# Patient Record
Sex: Male | Born: 1950 | Race: White | Hispanic: No | Marital: Single
Health system: Southern US, Community
[De-identification: ages and names within clinical notes are randomized; demographics above are authoritative.]

---

## 2013-09-14 ENCOUNTER — Inpatient Hospital Stay: Payer: Self-pay | Admitting: Internal Medicine

## 2013-09-14 LAB — COMPREHENSIVE METABOLIC PANEL
Albumin: 3.5 g/dL (ref 3.4–5.0)
Alkaline Phosphatase: 79 U/L
Anion Gap: 6 — ABNORMAL LOW (ref 7–16)
BUN: 26 mg/dL — AB (ref 7–18)
Bilirubin,Total: 0.9 mg/dL (ref 0.2–1.0)
CALCIUM: 9 mg/dL (ref 8.5–10.1)
Chloride: 100 mmol/L (ref 98–107)
Co2: 28 mmol/L (ref 21–32)
Creatinine: 1.33 mg/dL — ABNORMAL HIGH (ref 0.60–1.30)
EGFR (African American): >60
EGFR (Non-African Amer.): 56 — ABNORMAL LOW
GLUCOSE: 109 mg/dL — AB (ref 65–99)
Osmolality: 274 (ref 275–301)
POTASSIUM: 3.9 mmol/L (ref 3.5–5.1)
SGOT(AST): 12 U/L — ABNORMAL LOW (ref 15–37)
SGPT (ALT): 18 U/L (ref 12–78)
Sodium: 134 mmol/L — ABNORMAL LOW (ref 136–145)
TOTAL PROTEIN: 7.3 g/dL (ref 6.4–8.2)

## 2013-09-14 LAB — URINALYSIS, COMPLETE
BACTERIA: NONE SEEN
BILIRUBIN, UR: NEGATIVE
Blood: NEGATIVE
GLUCOSE, UR: NEGATIVE mg/dL (ref 0–75)
Hyaline Cast: 4
Leukocyte Esterase: NEGATIVE
Nitrite: NEGATIVE
PROTEIN: NEGATIVE
Ph: 6 (ref 4.5–8.0)
RBC,UR: 8 /HPF (ref 0–5)
SPECIFIC GRAVITY: 1.019 (ref 1.003–1.030)
Squamous Epithelial: NONE SEEN
WBC UR: <1 /HPF (ref 0–5)

## 2013-09-14 LAB — CBC WITH DIFFERENTIAL/PLATELET
BASOS PCT: 0.4 %
Basophil #: 0.1 10*3/uL (ref 0.0–0.1)
EOS ABS: 0 10*3/uL (ref 0.0–0.7)
EOS PCT: 0 %
HCT: 48.4 % (ref 40.0–52.0)
HGB: 16.1 g/dL (ref 13.0–18.0)
LYMPHS PCT: 7.6 %
Lymphocyte #: 1.6 10*3/uL (ref 1.0–3.6)
MCH: 32.3 pg (ref 26.0–34.0)
MCHC: 33.2 g/dL (ref 32.0–36.0)
MCV: 97 fL (ref 80–100)
Monocyte #: 1.1 x10 3/mm — ABNORMAL HIGH (ref 0.2–1.0)
Monocyte %: 5.5 %
NEUTROS PCT: 86.5 %
Neutrophil #: 18.1 10*3/uL — ABNORMAL HIGH (ref 1.4–6.5)
Platelet: 157 10*3/uL (ref 150–440)
RBC: 4.97 10*6/uL (ref 4.40–5.90)
RDW: 13.4 % (ref 11.5–14.5)
WBC: 20.9 10*3/uL — ABNORMAL HIGH (ref 3.8–10.6)

## 2013-09-14 LAB — CK TOTAL AND CKMB (NOT AT ARMC)
CK, TOTAL: 41 U/L (ref 35–232)
CK-MB: <0.5 ng/mL — ABNORMAL LOW (ref 0.5–3.6)

## 2013-09-14 LAB — PROTIME-INR
INR: 1
Prothrombin Time: 13.3 secs (ref 11.5–14.7)

## 2013-09-14 LAB — PRO B NATRIURETIC PEPTIDE: B-Type Natriuretic Peptide: 130 pg/mL — ABNORMAL HIGH (ref 0–125)

## 2013-09-14 LAB — RAPID INFLUENZA A&B ANTIGENS

## 2013-09-14 LAB — TROPONIN I: Troponin-I: <0.02 ng/mL

## 2013-09-14 LAB — APTT: Activated PTT: 26.2 secs (ref 23.6–35.9)

## 2013-09-15 LAB — COMPREHENSIVE METABOLIC PANEL
ALK PHOS: 65 U/L
ALT: 13 U/L (ref 12–78)
ANION GAP: 4 — AB (ref 7–16)
Albumin: 2.9 g/dL — ABNORMAL LOW (ref 3.4–5.0)
BUN: 19 mg/dL — ABNORMAL HIGH (ref 7–18)
Bilirubin,Total: 0.5 mg/dL (ref 0.2–1.0)
CALCIUM: 8.5 mg/dL (ref 8.5–10.1)
CREATININE: 1.07 mg/dL (ref 0.60–1.30)
Chloride: 105 mmol/L (ref 98–107)
Co2: 30 mmol/L (ref 21–32)
EGFR (African American): >60
EGFR (Non-African Amer.): >60
GLUCOSE: 100 mg/dL — AB (ref 65–99)
OSMOLALITY: 280 (ref 275–301)
Potassium: 4 mmol/L (ref 3.5–5.1)
SGOT(AST): 10 U/L — ABNORMAL LOW (ref 15–37)
SODIUM: 139 mmol/L (ref 136–145)
Total Protein: 6.3 g/dL — ABNORMAL LOW (ref 6.4–8.2)

## 2013-09-15 LAB — CBC WITH DIFFERENTIAL/PLATELET
BASOS ABS: 0.1 10*3/uL (ref 0.0–0.1)
Basophil %: 0.6 %
Eosinophil #: 0.1 10*3/uL (ref 0.0–0.7)
Eosinophil %: 0.9 %
HCT: 43.1 % (ref 40.0–52.0)
HGB: 14.2 g/dL (ref 13.0–18.0)
LYMPHS ABS: 1.4 10*3/uL (ref 1.0–3.6)
LYMPHS PCT: 12.3 %
MCH: 32.2 pg (ref 26.0–34.0)
MCHC: 32.8 g/dL (ref 32.0–36.0)
MCV: 98 fL (ref 80–100)
MONOS PCT: 5.1 %
Monocyte #: 0.6 x10 3/mm (ref 0.2–1.0)
Neutrophil #: 8.9 10*3/uL — ABNORMAL HIGH (ref 1.4–6.5)
Neutrophil %: 81.1 %
PLATELETS: 117 10*3/uL — AB (ref 150–440)
RBC: 4.4 10*6/uL (ref 4.40–5.90)
RDW: 13.4 % (ref 11.5–14.5)
WBC: 11 10*3/uL — AB (ref 3.8–10.6)

## 2013-09-16 LAB — CBC WITH DIFFERENTIAL/PLATELET
Basophil #: 0 10*3/uL (ref 0.0–0.1)
Basophil %: 0.4 %
Eosinophil #: 0.2 10*3/uL (ref 0.0–0.7)
Eosinophil %: 2.3 %
HCT: 40 % (ref 40.0–52.0)
HGB: 13.5 g/dL (ref 13.0–18.0)
Lymphocyte #: 1.4 10*3/uL (ref 1.0–3.6)
Lymphocyte %: 19 %
MCH: 32.8 pg (ref 26.0–34.0)
MCHC: 33.8 g/dL (ref 32.0–36.0)
MCV: 97 fL (ref 80–100)
Monocyte #: 0.4 x10 3/mm (ref 0.2–1.0)
Monocyte %: 5.8 %
Neutrophil #: 5.3 10*3/uL (ref 1.4–6.5)
Neutrophil %: 72.5 %
PLATELETS: 115 10*3/uL — AB (ref 150–440)
RBC: 4.12 10*6/uL — AB (ref 4.40–5.90)
RDW: 13.4 % (ref 11.5–14.5)
WBC: 7.4 10*3/uL (ref 3.8–10.6)

## 2013-09-16 LAB — BASIC METABOLIC PANEL
Anion Gap: 3 — ABNORMAL LOW (ref 7–16)
BUN: 9 mg/dL (ref 7–18)
CO2: 29 mmol/L (ref 21–32)
CREATININE: 0.86 mg/dL (ref 0.60–1.30)
Calcium, Total: 8.6 mg/dL (ref 8.5–10.1)
Chloride: 107 mmol/L (ref 98–107)
EGFR (African American): >60
EGFR (Non-African Amer.): >60
Glucose: 97 mg/dL (ref 65–99)
Osmolality: 276 (ref 275–301)
POTASSIUM: 3.6 mmol/L (ref 3.5–5.1)
Sodium: 139 mmol/L (ref 136–145)

## 2013-09-16 LAB — URINE CULTURE

## 2013-09-19 LAB — CULTURE, BLOOD (SINGLE)

## 2013-09-21 LAB — EXPECTORATED SPUTUM ASSESSMENT W GRAM STAIN, RFLX TO RESP C

## 2014-12-17 NOTE — Discharge Summary (Signed)
PATIENT NAME:  David BowensRICHARDSON, Montoya MR#:  [id number] DATE OF BIRTH:  1951-01-20  DATE OF ADMISSION:  09/14/2013 DATE OF DISCHARGE:    Addendum   While under further review, assessing the patient more carefully, the patient actually has paranoid thoughts that somebody is going to shoot him if he leaves here. I really am not quite sure that it will be safe leaving the facility for which I am going to start an involuntary commitment and not discharge him home. He was alert and oriented x 3 when we talked, but now he has some delusions that I think might be secondary to an organic cause, mainly the infection versus side effects of the steroids.   The patient has psychiatric history as well, and I think that, overall, he needs to be further reviewed before making the decision of the patient going home. I am going to ask psychiatry to come and see him right now and continue evaluation from there.  ____________________________ Felipa Furnaceoberto Sanchez Gutierrez, MD rsg:dmm D: 09/16/2013 12:29:00 ET T: 09/16/2013 13:04:29 ET JOB#: [id number]  cc: Felipa Furnaceoberto Sanchez Gutierrez, MD, <Dictator> Teegan Guinther Juanda ChanceSANCHEZ GUTIERRE MD ELECTRONICALLY SIGNED 09/19/2013 8:30

## 2014-12-17 NOTE — Discharge Summary (Signed)
PATIENT NAME:  David Lin, David Lin MR#:  [id number] DATE OF BIRTH:  September 23, 1950  DATE OF ADMISSION:  09/14/2013 DATE OF DISCHARGE:  09/16/2013  REASON FOR ADMISSION: Shortness of breath. He had also some level of confusion and hypotension.   HISTORY OF PRESENT ILLNESS: This is a 64 year old gentleman who lives with his son. Has history of hypertension, COPD, back problems. He is a heavy smoker. He was brought to the Emergency Department complaining of being confused and hypotensive. He has significant exacerbation of his COPD with significant shortness of breath. His confusion cleared up. Actually right now he is alert, oriented x 3, but he wants to be discharged immediately. The patient states that his daughter is in some trouble with her boyfriend for what he needs to go and attend to her. I have mentioned in the past that he is not ready to be discharged and one I do not want just to discharge him AMA,  I want to make sure that he gets his prescriptions for which a prescription for prednisone has been given to the patient to taper down from 50 every day for one day down to 0 mg, decreasing 10 mg every day. The patient is also going to be given a prescription for Levaquin 750 mg p.o. every day for the next six days to complete seven days treatment.   On admission, the patient had a temperature of 100.3 a pulse of 93 and blood pressure 72/50. IV fluids were given to improve his blood pressure and soon after his blood pressure improved he was able to go into regular hospital bed. The patient is starting to clear up his mentation, and again and right now he is alert, oriented x 3. He wants to go home, despite the fact that he might not be quite ready for it, but overall seems to be capable to make his own decisions at this moment.   Smoking cessation has been given to the patient.   As far as his dehydration, his creatinine was 1.33 on admission. He received IV fluids and right now he looks well hydrated. He  had acute hypoxic respiratory failure with oxygen saturation of 59, and he takes oxygen at home. The patient states that he has his oxygen at home, but he does not want to wait for us to help him getting a portable tank.   TIME SPENT: I spent about 45 minutes with this discharge today. The patient would have been going home against medical advice, but we decided to discharge him as he is able to make his own decisions and he feels good enough to go home.   ____________________________ Felipa Furnaceoberto Sanchez Gutierrez, MD rsg:sg D: 09/16/2013 12:14:37 ET T: 09/16/2013 12:24:53 ET JOB#: [id number]  cc: Felipa Furnaceoberto Sanchez Gutierrez, MD, <Dictator> Ashritha Desrosiers Juanda ChanceSANCHEZ GUTIERRE MD ELECTRONICALLY SIGNED 09/19/2013 8:30

## 2014-12-17 NOTE — H&P (Signed)
PATIENT NAME:  David Lin, David Lin MR#:  [id number] DATE OF BIRTH:  [birth year]  DATE OF ADMISSION:  09/14/2013  PRIMARY CARE PHYSICIAN:  Unknown.  REFERRING EMERGENCY ROOM PHYSICIAN:  Dr. Sharma CovertNorman, Ray[place] Citizenaroline.   CHIEF COMPLAINT: Confusion and hypotension.   HISTORY OF PRESENT ILLNESS: A 64 year old male. His son lives with him. He has past history of hypertension, smoker and COPD and some chronic back injury in the past. Since then, there is lower back pain as per the HPI in the ER records. His son brought him to the Emergency Room because he was complaining of some back pain and he was confused. When triage nurse saw him, she found him hypotensive and tachycardic and so brought him to one of the rooms and gave him IV fluids and on further initial work up he was found having mild dehydration with evidence of pneumonia on chest x-ray and elevated white cell count. On further examination, he was able to move his lower limbs to the full range without any significant discomfort or restriction of the moment because of pain. And on questioning, he says that this pain he has in the lower back and left hip is almost 15 years since he had some injury. Initially when ER physician saw him, the patient appeared a little confused, but he was agreeing that he had some cough. Denied any fever and agreed that he had some pain with the urine. Because of hypotension, she gave some IV fluids and the patient became a little more alert and oriented after receiving IV fluids. Blood pressure is reasonably stable after getting IV fluids, but hospitalist services is contacted for further management of his symptoms of sepsis.  REVIEW OF SYSTEMS:  Denies for fever, fatigue, weakness, but has chronic pain in the lower back.  EYES: No blurring, double vision, discharge or redness.  EARS, NOSE, THROAT: No tinnitus, ear pain or hearing loss.  RESPIRATORY: The patient says he has some cough but no sputum. No wheezing.  CARDIOVASCULAR: No  chest pain, orthopnea, edema or palpitations.  GASTROINTESTINAL: No nausea, vomiting, diarrhea, abdominal pain.  GENITOURINARY: The patient states he has some dysuria but no hematuria or increased frequency.  ENDOCRINE: No increased sweating. No heat or cold intolerance.  SKIN: No acne, rashes or lesions.  MUSCULOSKELETAL: Has chronic low back pain and complaint of pain in the left hip. No swelling.  NEUROLOGICAL: No numbness, weakness, tremor or vertigo.  PSYCHIATRIC: Denies any anxiety, insomnia or bipolar disorder.   PAST MEDICAL HISTORY: Hypertension, COPD, chronic back pain, hyperlipidemia.   PAST SURGICAL HISTORY: Knee replacement surgery on the left.   SOCIAL HISTORY: Lives with son, able to walk without any support. He is a smoker, 1-1/2 packs of cigarettes every day and denies any alcohol or drug use.   FAMILY HISTORY: Denies coronary artery disease or cancer.   HOME MEDICATIONS: 1.  Acetaminophen and hydrocodone 325/10 mg oral tablet every 6 hours.  2.  Aspirin 325 mg oral tablet once a day.  3.  Benztropine 0.5 mg oral tablet once a day.  4.  Breo inhaler 1 puff inhalation once a day.  5.  Clonazepam 1 mg oral tablet 2 times a day.  6.  Daliresp 500 mcg oral tablet once a day.  7.  Gabapentin 300 mg oral capsule 3 times a day.  8.  Hydrochlorothiazide plus lisinopril 12.5/10 mg oral tablet once a day.  9.  Loratadine 10 mg oral tablet once a day.  10.  Nexium 40 mg oral  delayed-release capsule once a day.  11.  Simvastatin 10 mg oral tablet once a day.  12.  Tudorza 400 mcg inhalation powder 1 puff 2 times a day.  PHYSICAL EXAMINATION: VITAL SIGNS:  In ER, temperature 100.3, pulse 93, blood pressure 72/50 and oxygen saturation 92 on oxygen supplementation.  GENERAL: The patient is somewhat disheveled but he is oriented to time, place and person. Appears a little bit slow mentally at this time, but he knows about the year, month, president's name and where he is. HEAD AND  NECK:  Atraumatic. Conjunctivae pink. Oral mucosa moist.  NECK: Supple. No JVD.  RESPIRATORY: Bilateral equal air entry, some crepitation present.  CARDIOVASCULAR: S1, S2 present, regular. No murmur.  ABDOMEN: Soft, nontender. Bowel sounds present. No organomegaly.  SKIN: No rashes. LEGS: No edema.  NEUROLOGICAL: Power 5/5. Moves all 4 limbs, able to raise both lower limbs up to 60 degree angle with the stretcher. No tremors.  PSYCHIATRIC: Does not appear in any acute illness at this time.  IMPORTANT LABORATORY RESULTS:  BNP 130, glucose 109, BUN 26, creatinine 1.33. Sodium 134, potassium 3.9, total protein 7.3, bilirubin 0.9, alkaline phosphatase 79, SGOT 12 and SGPT 18.  Troponin less than 0.02. WBC 20,900. Hemoglobin 16.1, platelets 157, MCV 97.  INR 1. Influenza A and B are negative. Urinalysis is grossly negative. ABG: PH 7.38, pCO2 41 and pO2 is 59 on the 24% FiO2.  EKG shows sinus tachycardia with ventricular rate up to 107. Chest x-ray is reported without no acute cardiopulmonary disease but when I reviewed the picture, it is having right lower lobe infiltrative changes and some mild infiltrate in left lower lobe also.   ASSESSMENT AND PLAN: A 64 year old male with past medical history of hypertension, chronic obstructive pulmonary disease and smoker, came to Emergency Room with some back pain, but found having evidence of pneumonia on chest x-ray, elevated white cell count and sepsis with hypotension and tachycardia.  1.  Sepsis. This is secondary to pneumonia. Will treat the underlying cause. He received IV fluids 2 liters and blood pressure responded and currently he is running on lower normal side. We will continue IV fluids and continue monitoring.  2.  Pneumonia. Because of presence with sepsis, we will treat it with broad-spectrum antibiotics, Zosyn, vancomycin and erythromycin and do sputum culture and blood culture.  3.  Chronic obstructive pulmonary disease. Currently, there is no  wheezing. No signs of exacerbation. We will continue his nebulizers as he was getting at home.  4.  Acute hypoxic respiratory failure. His pO2 on ABG is 59 on 24% FiO2 supplementation. We will continue giving him supplemental oxygen and will repeat the chest x-ray tomorrow to see further evidence of his pulmonary findings.  5.  Dehydration. Creatinine is 1.33. Currently, we do not have his baseline but we will give IV fluids and follow it tomorrow for further correction.  6.  Altered mental status. This was present on admission, especially on arrival to Emergency Room. Most likely it is metabolic encephalopathy secondary to infection and sepsis but appears to be resolving after getting IV fluids, 2 liters. Still mild slowness present, but he is oriented and able to give me his information. We will treat underlying cause. CT scan of the head is negative.  7.  Smoking. The patient is a current smoker. Cessation counseling is done for 4 minutes and offered him nicotine patch.  He agreed to try it here.  TOTAL TIME SPENT ON THIS ADMISSION:  Critical  care: 60 minutes   ____________________________ Hope Pigeon Elisabeth Pigeon, MD vgv:ce D: 09/14/2013 16:02:00 ET T: 09/14/2013 17:19:29 ET JOB#: [id number]  cc: Hope Pigeon. Elisabeth Pigeon, MD, <Dictator> Altamese Dilling MD ELECTRONICALLY SIGNED 09/17/2013 18:54

## 2014-12-17 NOTE — Discharge Summary (Signed)
PATIENT NAME:  David Lin, David MR#:  [id number] DATE OF BIRTH:  [birth year]  DATE OF ADMISSION:  09/14/2013 DATE OF DISCHARGE:  09/16/2013  Addendum to Discharge Summary   The patient actually ended up leaving after the family went up and picked him up. Due to the confusion of the patient and some degree of delirium, we decided to initiate involuntary commitment papers. We contacted psychiatry department, Dr. Garnetta BuddyFaheem, who evaluated him, and she noticed that he was really confused and he was delirious. She thought the delirium was mostly secondary to benzodiazepines withdrawal. The patient was put back on his chronic benzos, and she established that he was not dangerous or a danger to himself or anybody else, for which he could leave if he wanted to. I convinced him to stay until his family came to pick him up, and he did stay. His son picked him up in the afternoon, and they were comfortable taking him home.   As per medical problems go:  1. Acute delirium. This is a mix of problems secondary to sepsis, secondary to steroid use and also secondary to benzodiazepine withdrawal. The patient was not safe to be discharged earlier, but now that the family picked him up, he will be safe to go.  2. Sepsis. The patient was admitted with significant hypotension, meeting criteria for severe sepsis. He was tachycardic, and his temperatures were slightly elevated. The patient required IV fluids up to 2 liters, and his blood pressure responded well. Source of this was pulmonary, likely pneumonia. Sepsis syndrome has resolved by the time the patient is discharged, and it was present on admission.  3. Pneumonia, community acquired. The patient had a chest x-ray by radiology described as not active cardiopulmonary disease, but clinically, the patient had significant respiratory problems, and we categorized his x-ray as showing beginning of an infiltrate on the perihilar area on the right, which is consistent with  pneumonia. The patient treated with Levaquin. 4. Chronic obstructive pulmonary disease. The patient was on steroids. Steroids were tapered down. At discharge, was given a prescription for prednisone. Since the patient had some psychiatrist problems, including bipolar, that might have helped him to be delirious. Now that he is on benzodiazepines, be better, and he is going to taper down his steroids quickly. 6. Acute hypoxic respiratory failure. His ABGs show a pO2 of 59 on 24% FiO2. He required supplemental oxygen. He does have acute on chronic respiratory failure. He uses 2 liters at home. The patient says that he does not use it all the time. He ambulates without oxygen occasionally.  7. Dehydration. IV fluids were given. 8. Altered mental status. As mentioned above, he was delirious.  9. Smoking. Smoking cessation has been given to the patient.  TIME SPENT: I spent about 75 minutes at discharge on this patient.   ____________________________ Felipa Furnaceoberto Sanchez Gutierrez, MD rsg:lb D: 09/18/2013 06:37:14 ET T: 09/18/2013 07:13:12 ET JOB#: [id number]  cc: Felipa Furnaceoberto Sanchez Gutierrez, MD, <Dictator> Katonya Blecher Juanda ChanceSANCHEZ GUTIERRE MD ELECTRONICALLY SIGNED 09/19/2013 8:31

## 2014-12-17 NOTE — Consult Note (Signed)
PATIENT NAME:  David Lin, David Lin MR#:  [id number] DATE OF BIRTH:  1951-01-09  DATE OF CONSULTATION:  09/16/2013  REFERRING PHYSICIAN:   CONSULTING PHYSICIAN:  Linford Quintela S. Garnetta Buddy, MD  REASON FOR CONSULTATION:  Confusion and paranoid ideation and questionable involuntary commitment.   HISTORY OF PRESENT ILLNESS:  The patient is a 64 year old male who presented to the Emergency Room as he has been having back pain and was confused. When the triage nurse saw him in the ED, he was found to be hypotensive and tachycardic and he was given IV fluids and the further workup showed dehydration and evidence of pneumonia. He was admitted to the medical floor. The patient was able to move his lower limbs without any significant discomfort or restriction of the movement. The patient was complaining of lower back and left hip pain for the past 15 years. Initially the ER physician saw him, he appeared a little confused but he also agreed that he has cough. The patient was stabilized on the medical floor and he was about to be discharged when he started showing some signs of paranoia and was telling the staff that the bank robbers are after him and they are going to shoot him. He appeared to be somewhat hallucinating. His medical physicians started becoming concerned about his behavior and placed a psychiatric consult.   During my interview, the patient appeared tremulous and stated that he is currently in the hospital. He reported that he is concerned because the second and the third shift staff do not behavior well and they are giving him some medications. He stated that he is taking gabapentin for his nerve pain as well as some other medication for his anxiety. He reported that he is shaking because he has not received the medication in the past few days. He reported that he follows with his primary care physician and was trying to pull out the card of his primary care physician from his wallet. However, his tremors became  worse when he was showing me the card. He also stated that he does not have glasses at this time and is unable to read the names. However, when he showed me his card, it says [facility] with the names of Dr. Ardelle Park and Dr. Allena Katz. He stated that he follows with them on a regular basis. The patient stated that he wants to leave and he will go home and come back after the weekend. He stated that he does not feel safe staying in the hospital because he has some problems with the staff here. He does not appear to be having any auditory or visual hallucinations at this time.   PAST PSYCHIATRIC HISTORY:  The patient reported that he is getting Klonopin for his anxiety as well as gabapentin for his nerve pain. He denied feeling depressed or anxious at this time. He denied having any thoughts to harm himself or anybody else.   PAST MEDICAL HISTORY:  Hypertension, COPD, chronic back pain, hyperlipidemia, status post knee replacement surgery on the left.   SOCIAL HISTORY:  The patient currently lives with his son and able to walk without any support. He is a smoker and smokes only 1-1/2 pack of cigarettes on a daily basis. He denied any use of drugs or alcohol. He stated sometimes he drinks a shot of beer whenever his anxiety gets worse.   FAMILY HISTORY:  Denies coronary artery disease or cancer.   CURRENT HOME MEDICATIONS:  1.  Acetaminophen and hydrocodone 325/10 every 6  hours.  2.  Aspirin 325 mg once daily.  3.  Benztropine 0.5 mg once a day. 4.  Breo inhaler 1 puff once daily.  5.  Clonazepam 1 mg twice a day. 6.  Daliresp 500 mcg once daily.  7.  Gabapentin 300 mg t.i.d. 8.  Hydrochlorothiazide plus lisinopril 12.5/10 once daily. 9.  Loratadine 10 mg daily.  10.  Nexium 40 mg daily.  11.  Simvastatin 10 mg daily. 12.  Tudorza 400 mcg inhaler twice daily.   VITAL SIGNS:  Temperature 98.8, pulse 75, respirations 18, blood pressure 96/55.   LABORATORY, DIAGNOSTIC, AND RADIOLOGICAL  DATA:  Glucose 97, BUN 9, creatinine 0.86, sodium 139, potassium 3.6, chloride 107, bicarbonate 29, creatinine anion gap 3, osmolality 276. Protein 6.3, albumin is 2.9, bilirubin 20.5, alkaline phosphatase 65, AST 10, ALT 13. WBC 7.4, RBC 4.12, hemoglobin 13.5, platelet count 115, MCV is 97, RDW is 13.4.   MENTAL STATUS EXAMINATION:  The patient is a moderately built male who appeared his stated age. He was tremulous during the interview. He maintained fair eye contact. His speech was low in tone and volume. Mood was anxious. Affect was congruent. Thought processes are circumstantial. Thought content was nondelusional. He currently denied having any suicidal ideations or plans. He appeared apprehensive during the interview. He is not responding to any internal stimuli at this time.   DIAGNOSTIC IMPRESSION:  AXIS I: 1.  Anxiety disorder due to general medical condition, chronic obstructive                                 pulmonary disease.                2.  Benzodiazepine dependence.  AXIS II: None.  AXIS III: Please review the medical history.   TREATMENT PLAN:  1.  The patient does not meet the criteria for involuntary commitment at this time as he is not having any suicidal or homicidal ideations or plans. The case discussed with the staff as well as Dr. Mordecai MaesSanchez.  2.  I will restart him back on clonazepam 1 mg p.o. b.i.d. and the first dose given at this time.  3.  I will also start him on Haldol 2 mg p.o. b.i.d. for the next 3 days too help him with the paranoia.  I discussed the case with Dr. Mordecai MaesSanchez and he will  discharged him in the next few days. The patient agreed to stay in the hospital to complete his treatment. He will be monitored closely by the staff.   Thank you for allowing me to participate in the care of this patient.   ____________________________ Ardeen FillersUzma S. Garnetta BuddyFaheem, MD usf:jm D: 09/16/2013 14:48:11 ET T: 09/16/2013 15:13:53 ET JOB#: [id number]  cc: Ardeen FillersUzma S. Garnetta BuddyFaheem, MD,  <Dictator> Rhunette CroftUZMA S Iridessa Harrow MD ELECTRONICALLY SIGNED 09/16/2013 15:58

## 2015-01-26 IMAGING — CR DG CHEST 2V
1 series · 2 of 2 positions shown · non-contrast
Comparison: 03/22/2012

CLINICAL DATA: Cough.  Altered mental status.

EXAM:
CHEST  2 VIEW

[Series 2: x chest ap · 0.14mm/px · 2 of 2 slices shown]
[im 1/2]
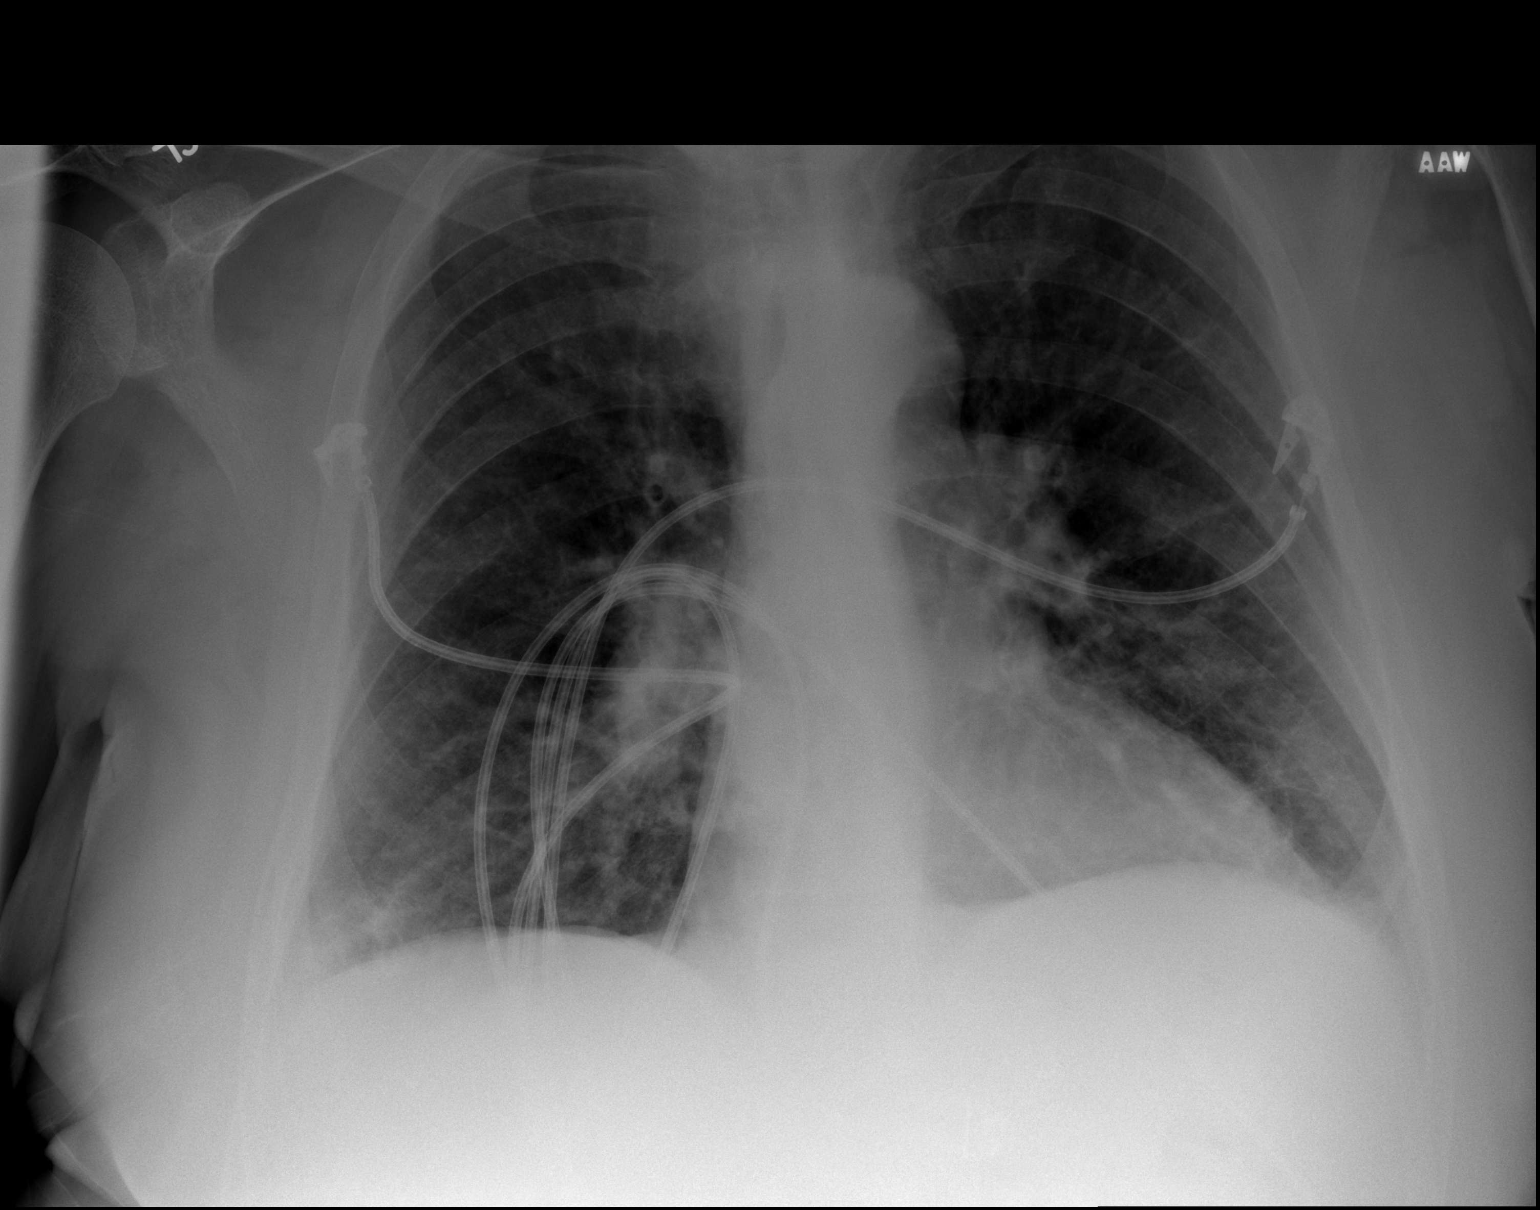
[im 2/2]
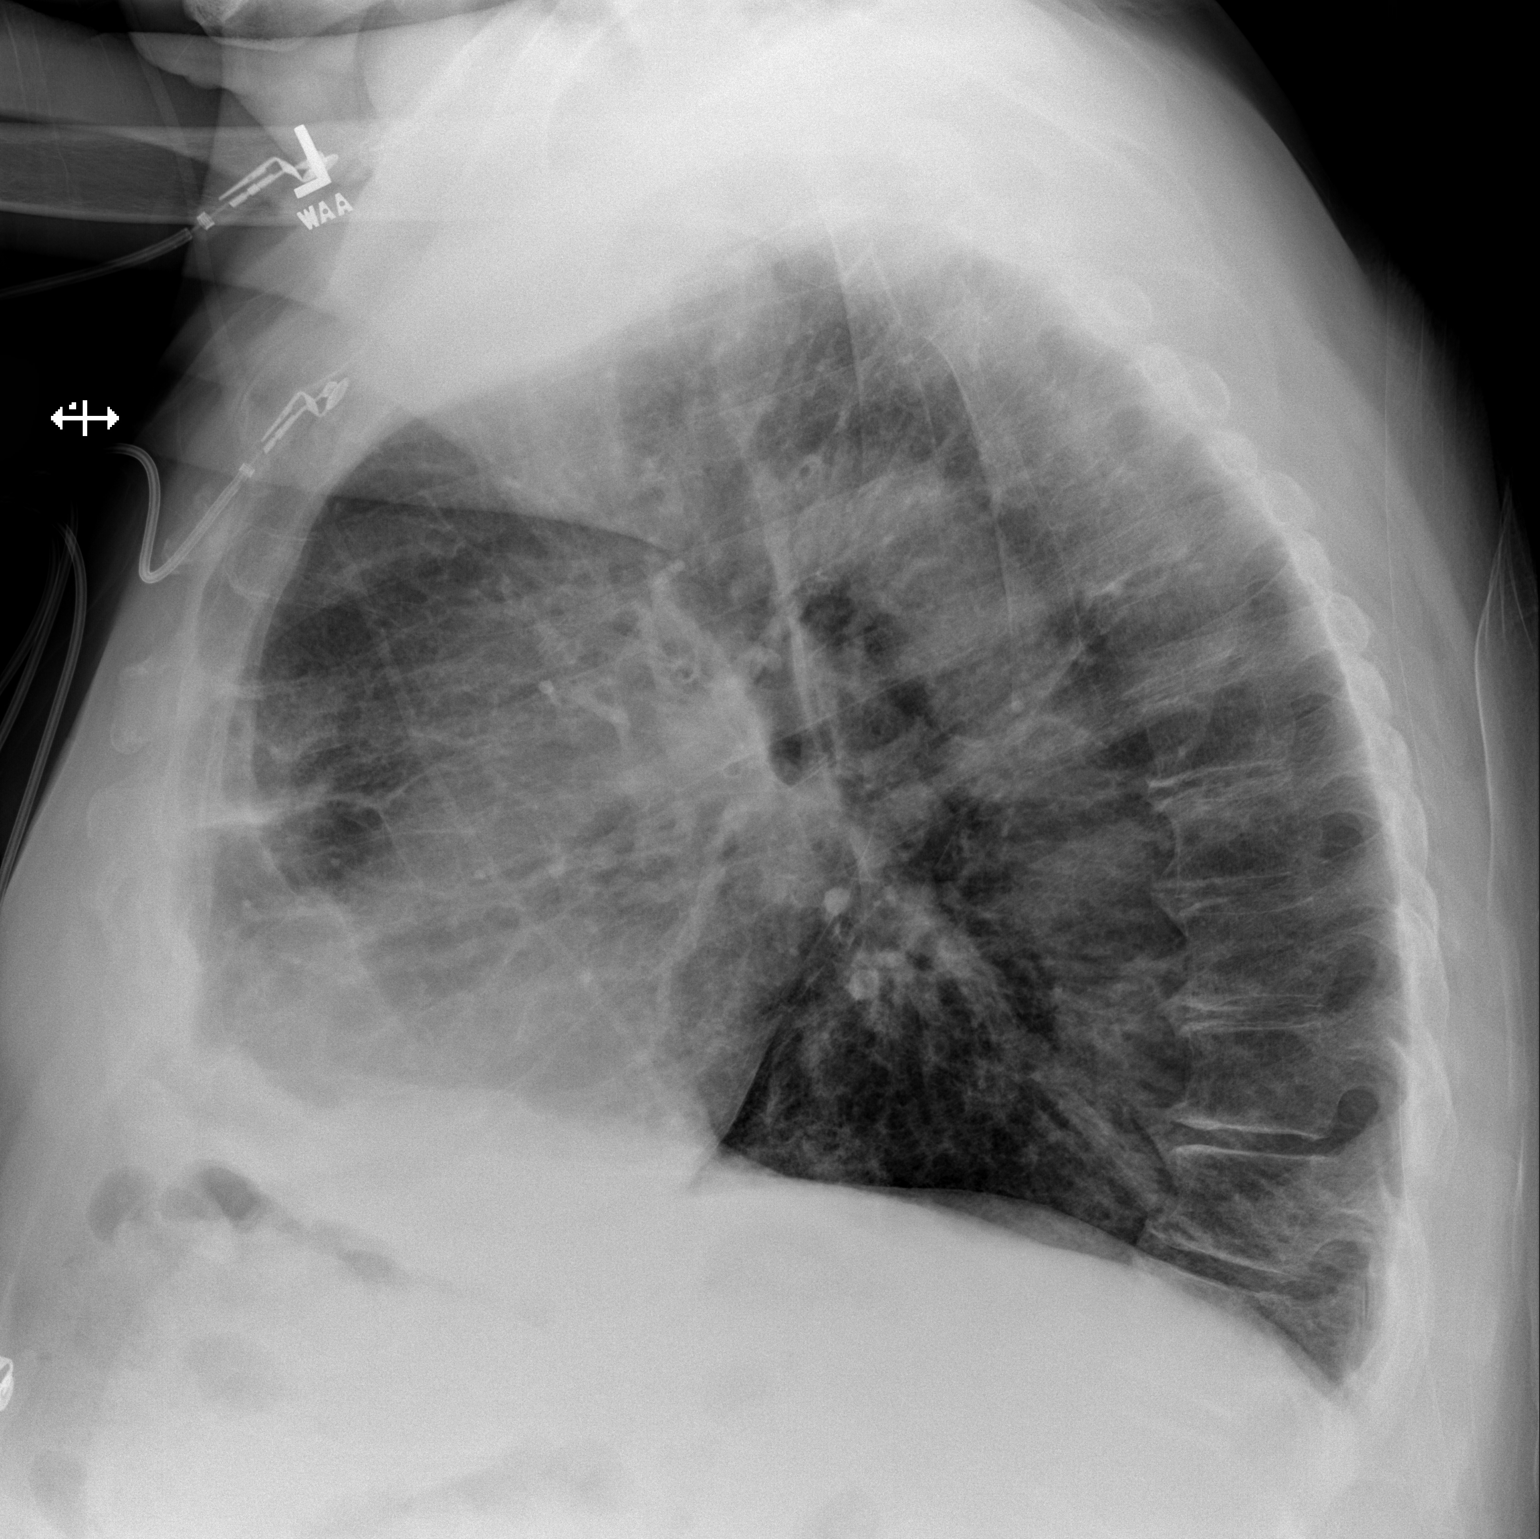

[2 of 2 positions shown; findings below may reference images not displayed]

FINDINGS: There are coarse reticular opacities in the lung bases most evident
anteriorly, likely combination scarring and subsegmental
atelectasis. No focal consolidation. No evidence of pulmonary edema.
The lungs are hyperexpanded.

No pleural effusion or pneumothorax.

Cardiac silhouette is normal in size and configuration. Normal
mediastinal and hilar contours.

Bony thorax is demineralized but intact.
IMPRESSION: No acute cardiopulmonary disease.

## 2015-01-26 IMAGING — CT CT HEAD WITHOUT CONTRAST
1 series · 16 of 30 positions shown, 20 images · non-contrast
Comparison: 03/22/2012

CLINICAL DATA: Altered mental status

EXAM:
CT HEAD WITHOUT CONTRAST
TECHNIQUE: Contiguous axial images were obtained from the base of the skull
through the vertex without intravenous contrast.

[Series 2: head wo · axial · 0.39mm/px · z∈[+206,+346]mm · 16 of 32 slices shown, 20 images]
[im 2/32  brain]
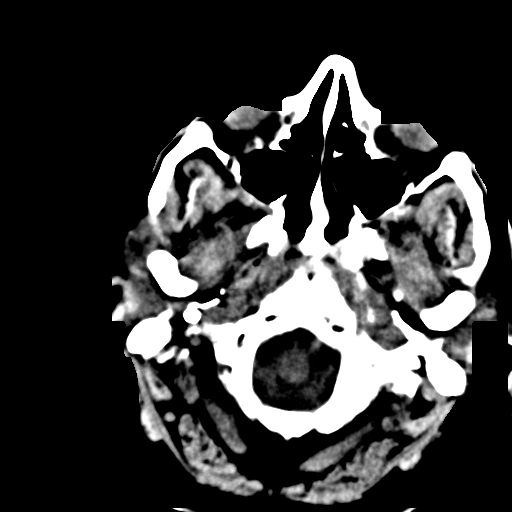
[im 2/32  bone]
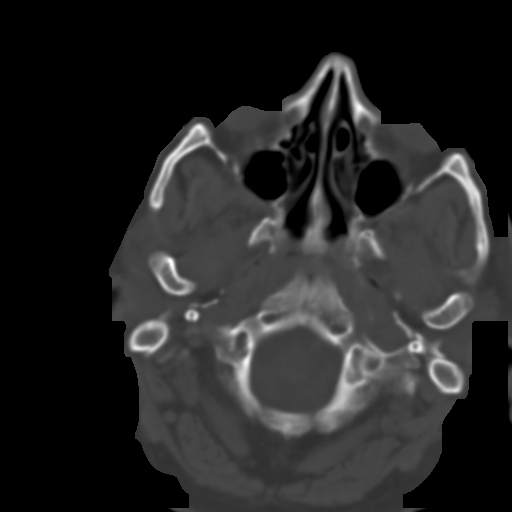
[im 4/32  brain]
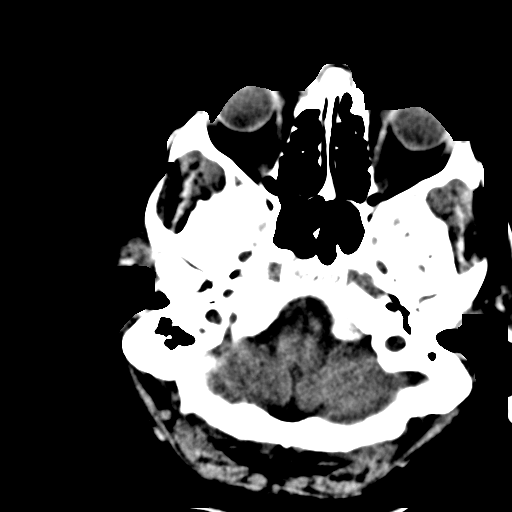
[im 6/32  brain]
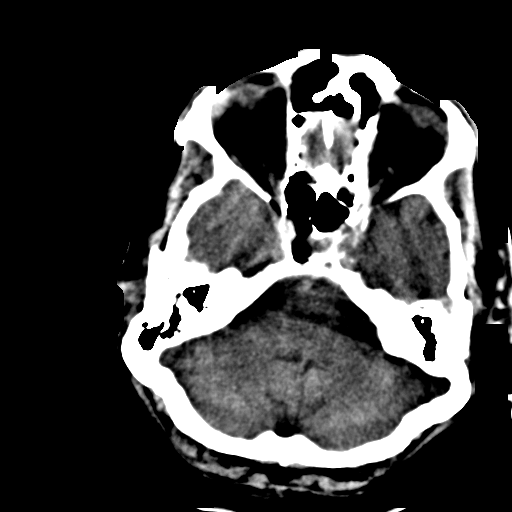
[im 8/32  brain]
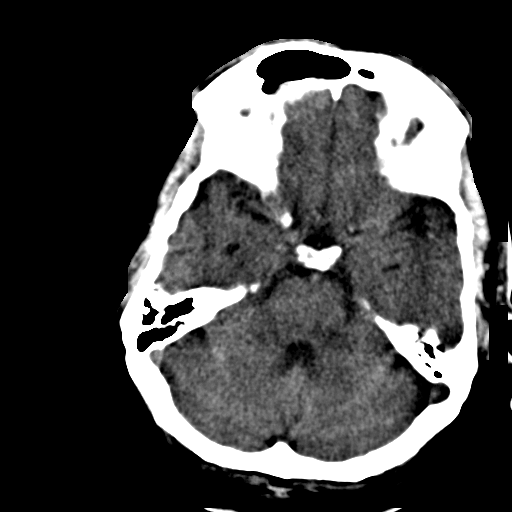
[im 9/32  brain]
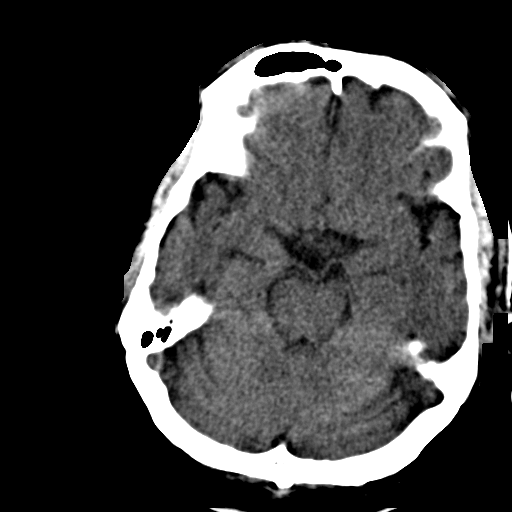
[im 9/32  bone]
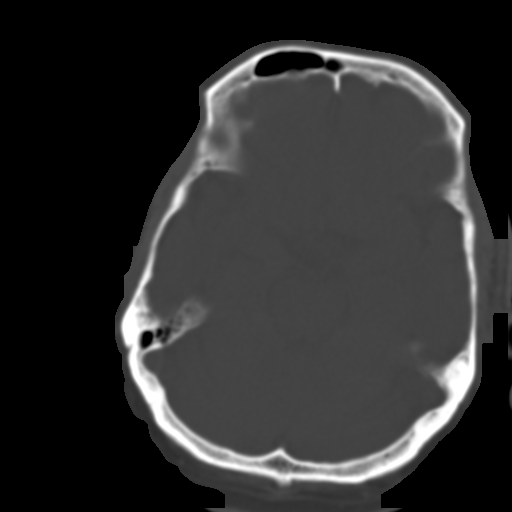
[im 11/32  brain]
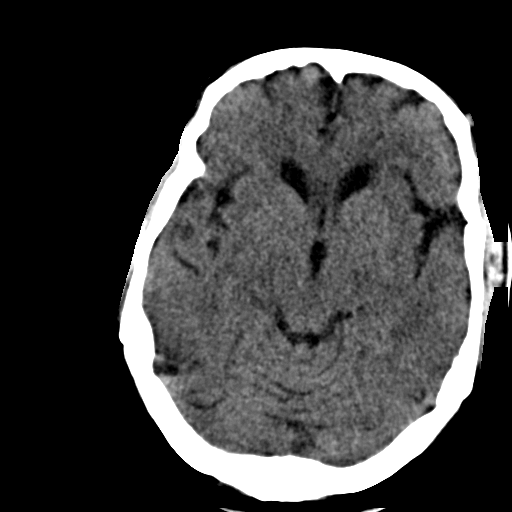
[im 13/32  brain]
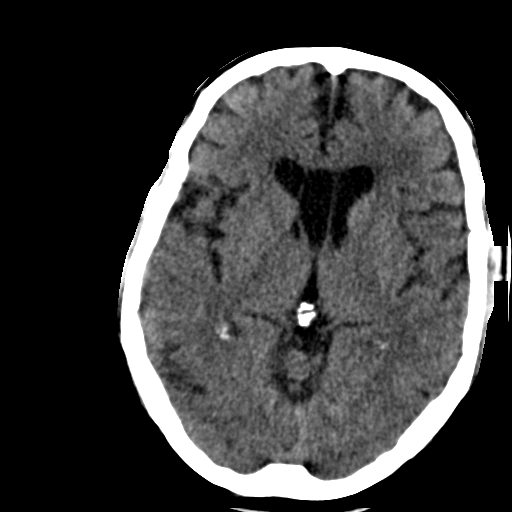
[im 15/32  brain]
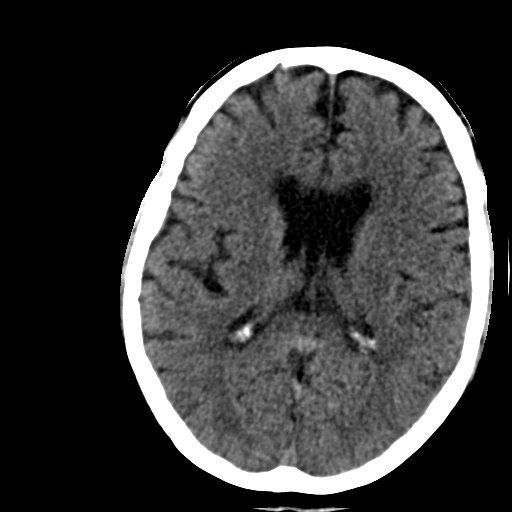
[im 17/32  brain]
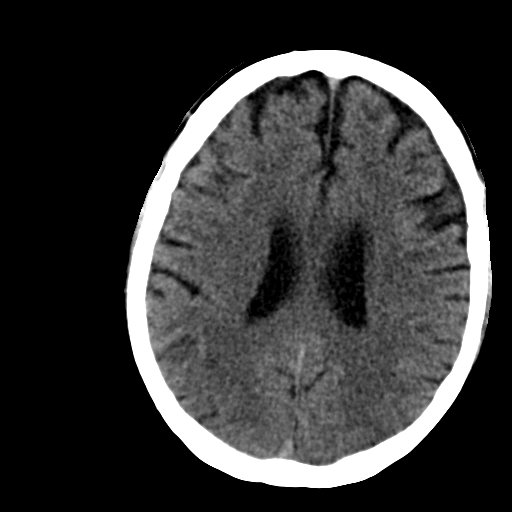
[im 17/32  bone]
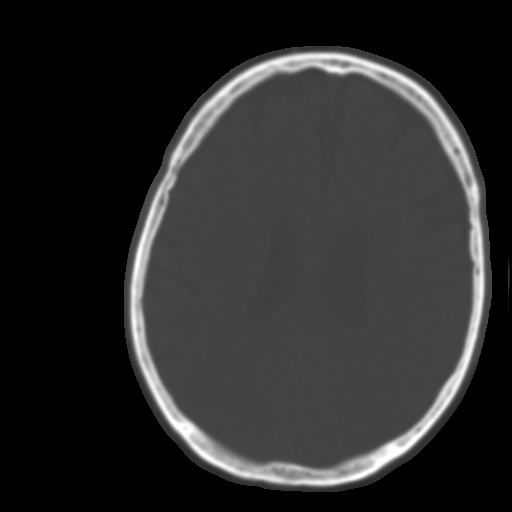
[im 19/32  brain]
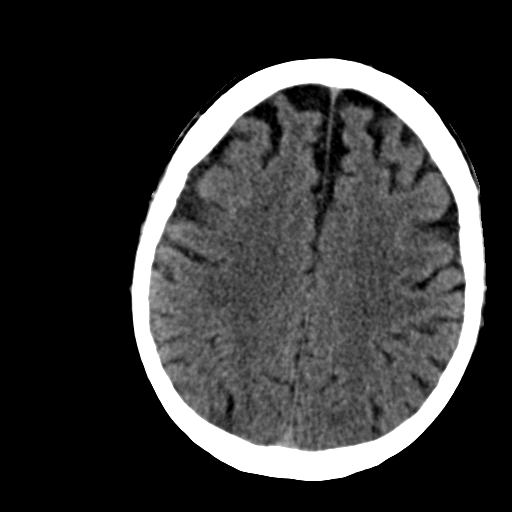
[im 21/32  brain]
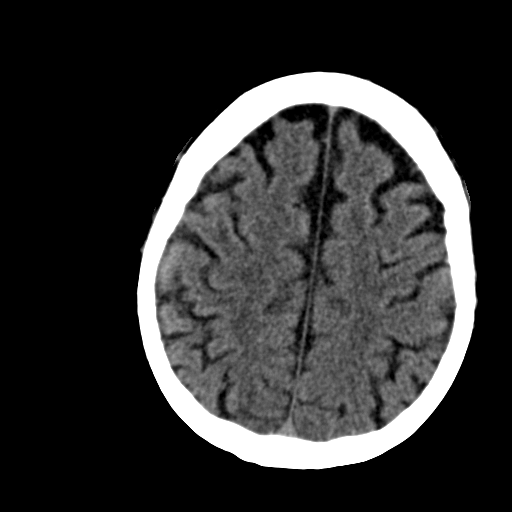
[im 23/32  brain]
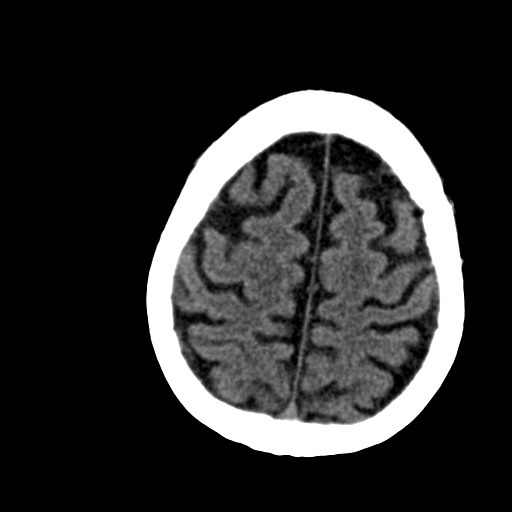
[im 24/32  brain]
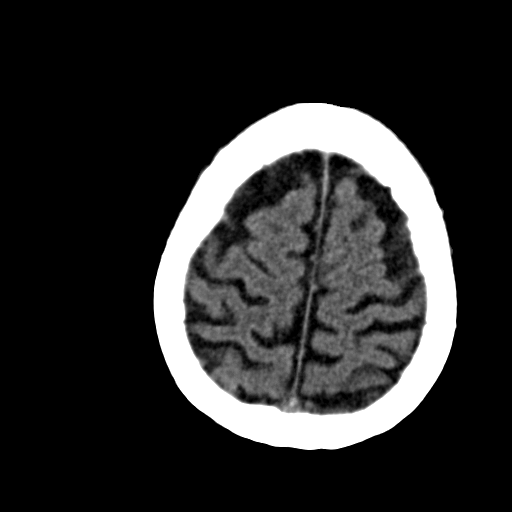
[im 24/32  bone]
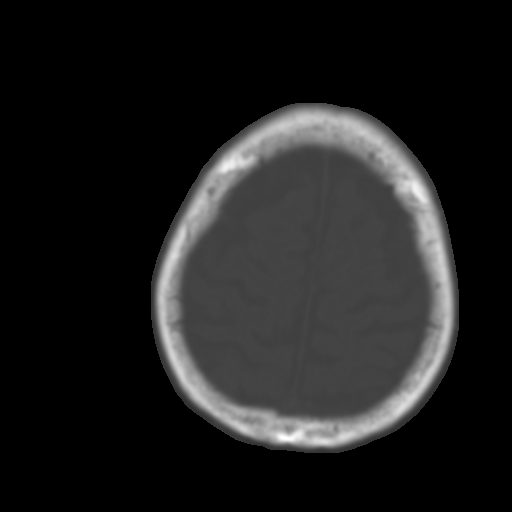
[im 26/32  brain]
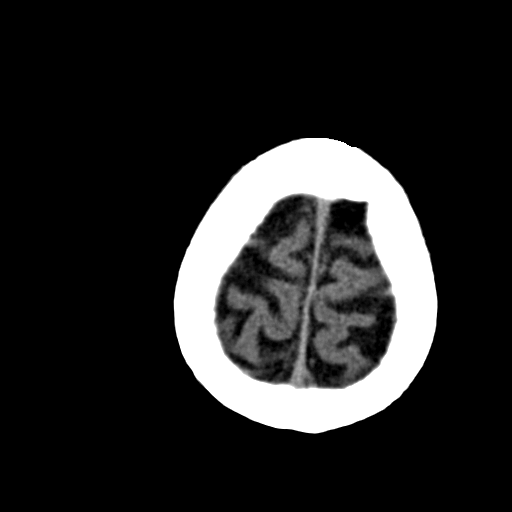
[im 28/32  brain]
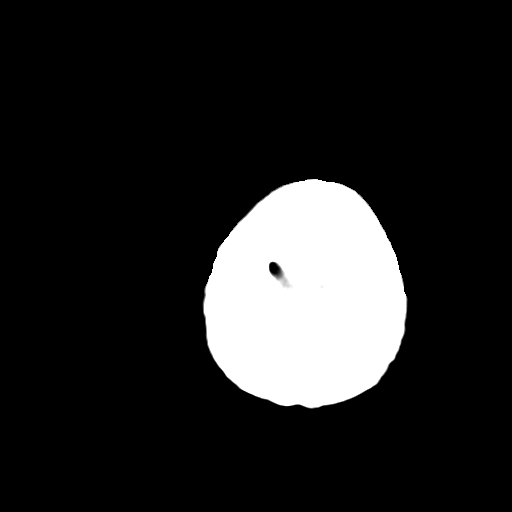
[im 30/32  brain]
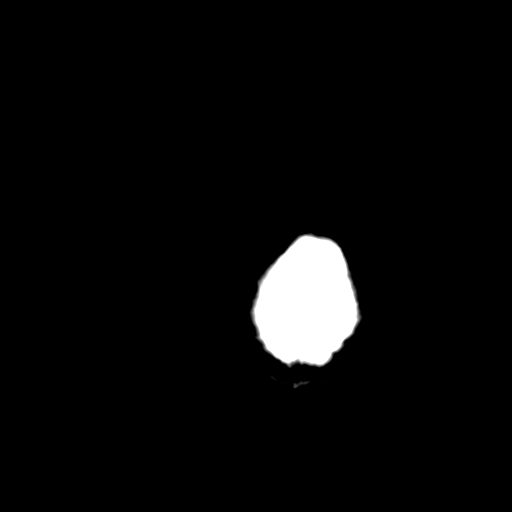

[16 of 30 positions shown; findings below may reference images not displayed]

FINDINGS: No skull fracture is noted. No intracranial hemorrhage, mass effect
or midline shift. Paranasal sinuses and mastoid air cells are
unremarkable.

Stable cerebral atrophy. Mild periventricular white matter decreased
attenuation probable due to chronic small vessel ischemic changes.
No acute cortical infarction. No mass lesion is noted on this
unenhanced scan.
IMPRESSION: No acute intracranial abnormality. Mild cerebral atrophy. Mild
periventricular white matter decreased attenuation probable due to
chronic small vessel ischemic changes. No definite acute cortical
infarction.

## 2015-07-27 DEATH — deceased
